# Patient Record
Sex: Male | Born: 2000 | Race: White | Hispanic: No | Marital: Single | Smoking: Never smoker
Health system: Southern US, Community
[De-identification: ages and names within clinical notes are randomized; demographics above are authoritative.]

## PROBLEM LIST (undated history)

## (undated) DIAGNOSIS — J45909 Unspecified asthma, uncomplicated: Secondary | ICD-10-CM

## (undated) DIAGNOSIS — L709 Acne, unspecified: Secondary | ICD-10-CM

## (undated) HISTORY — PX: TONSILLECTOMY: SUR1361

## (undated) HISTORY — PX: OTHER SURGICAL HISTORY: SHX169

---
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
## 2018-06-01 ENCOUNTER — Encounter: Payer: Self-pay | Admitting: Emergency Medicine

## 2018-06-01 ENCOUNTER — Other Ambulatory Visit: Payer: Self-pay

## 2018-06-01 ENCOUNTER — Ambulatory Visit
Admission: EM | Admit: 2018-06-01 | Discharge: 2018-06-01 | Disposition: A | Discharge status: Home / Self Care | Payer: Managed Care, Other (non HMO) | Attending: Family Medicine | Admitting: Family Medicine

## 2018-06-01 DIAGNOSIS — H6690 Otitis media, unspecified, unspecified ear: Secondary | ICD-10-CM

## 2018-06-01 MED ORDER — AMOXICILLIN 875 MG PO TABS: 875.0000 mg | ORAL_TABLET | Freq: Two times a day (BID) | ORAL | 0 refills | Status: AC

## 2018-06-01 NOTE — ED Provider Notes (Signed)
[facility] ____________________________________________  Time seen: Approximately 8:05 PM  I have reviewed the triage vital signs and the nursing notes.   HISTORY  Chief Complaint Otalgia (right)   HPI Andre Brooks is a 17 y.o. male presents with mother at bedside for evaluation of right ear pain.  Reports right ear pain has been present for the last 3 days.  States does feel some ear pain with chewing as well as touching in front of his ear.  Denies any skin changes.  Reports has been swimming and wakeboarding a lot recently.  Denies abrupt onset or trauma.  Denies change in hearing, ear drainage or bleeding.  Denies accompanying cough, congestion, fevers or sore throat.  Reports continues to eat and drink well.  Denies aggravating or alleviating factors.  Reports otherwise feels well.  Denies recent sickness. Denies recent antibiotic use.   Mickey Farber, MD: PCP   Past Medical History:  Diagnosis Date  . Acne    on unknown acne med  . Asthma     There are no active problems to display for this patient.   Past Surgical History:  Procedure Laterality Date  . addenoids    . TONSILLECTOMY       No current facility-administered medications for this encounter.   Current Outpatient Medications:  .  cetirizine (ZYRTEC) 10 MG tablet, Take 10 mg by mouth daily., Disp: , Rfl:  .  amoxicillin (AMOXIL) 875 MG tablet, Take 1 tablet (875 mg total) by mouth 2 (two) times daily., Disp: 20 tablet, Rfl: 0  Allergies Patient has no known allergies.  Family History  Problem Relation Age of Onset  . Healthy Mother   . Healthy Father     Social History Social History   Tobacco Use  . Smoking status: Never Smoker  . Smokeless tobacco: Never Used  Substance Use Topics  . Alcohol use: No  . Drug use: Not on file    Review of Systems Constitutional: No fever ENT: No sore throat. As above.  Cardiovascular: Denies chest pain. Respiratory: Denies shortness of  breath. Gastrointestinal: No abdominal pain.   Musculoskeletal: Negative for back pain. Skin: Negative for rash.  ____________________________________________   PHYSICAL EXAM:  VITAL SIGNS: ED Triage Vitals  Enc Vitals Group     BP 06/01/18 1846 126/74     Pulse Rate 06/01/18 1846 (!) 55     Resp 06/01/18 1846 16     Temp 06/01/18 1846 98 F (36.7 C)     Temp Source 06/01/18 1846 Oral     SpO2 06/01/18 1846 100 %     Weight 06/01/18 1845 180 lb (81.6 kg)     Height 06/01/18 1845 6\' 1"  (1.854 m)     Head Circumference --      Peak Flow --      Pain Score 06/01/18 1845 2     Pain Loc --      Pain Edu? --      Excl. in GC? --     Constitutional: Alert and oriented. Well appearing and in no acute distress. Eyes: Conjunctivae are normal.  Head: Atraumatic. No sinus tenderness to palpation. No swelling. No erythema.  Ears: Left: Nontender, no erythema, normal TM.  Right: No tenderness auricle movement, normal canal, moderate erythema and dull TM, no drainage.  TMs appear intact.  No surrounding tenderness, swelling or erythema bilaterally.  Nose:No nasal congestion   Mouth/Throat: Mucous membranes are moist. No pharyngeal erythema. No tonsillar swelling or exudate.  Neck: No stridor.  No cervical spine tenderness to palpation. Hematological/Lymphatic/Immunilogical: No cervical lymphadenopathy. Cardiovascular: Normal rate, regular rhythm. Grossly normal heart sounds.  Good peripheral circulation. Respiratory: Normal respiratory effort.  No retractions. No wheezes, rales or rhonchi. Good air movement.  Musculoskeletal: Ambulatory with steady gait.  Neurologic:  Normal speech and language. No gait instability. Skin:  Skin appears warm, dry Psychiatric: Mood and affect are normal. Speech and behavior are normal.  ___________________________________________   LABS (all labs ordered are listed, but only abnormal results are displayed)  Labs Reviewed - No data to  display ____________________________________________  PROCEDURES Procedures    INITIAL IMPRESSION / ASSESSMENT AND PLAN / ED COURSE  Pertinent labs & imaging results that were available during my care of the patient were reviewed by me and considered in my medical decision making (see chart for details).  Well-appearing patient.  No acute distress.  Right otitis media noted.  Will treat with oral amoxicillin.  Encourage rest, fluids, supportive care.Discussed indication, risks and benefits of medications with patient.  Discussed follow up with Primary care physician this week. Discussed follow up and return parameters including no resolution or any worsening concerns. Patient verbalized understanding and agreed to plan.   ____________________________________________   FINAL CLINICAL IMPRESSION(S) / ED DIAGNOSES  Final diagnoses:  Acute otitis media, unspecified otitis media type     ED Discharge Orders        Ordered    amoxicillin (AMOXIL) 875 MG tablet  2 times daily     06/01/18 1950       Note: This dictation was prepared with Dragon dictation along with smaller phrase technology. Any transcriptional errors that result from this process are unintentional.         Renford DillsMiller, Lavon Horn, NP 06/01/18 2046

## 2018-06-01 NOTE — Discharge Instructions (Addendum)
Take medication as prescribed. Rest. Drink plenty of fluids.  ° °Follow up with your primary care physician this week as needed. Return to Urgent care for new or worsening concerns.  ° °

## 2018-06-01 NOTE — ED Triage Notes (Signed)
Patient in today c/o right ear pain x 3 days. Patient denies fever. Patient has not tried any OTC medications.

## 2019-05-18 ENCOUNTER — Other Ambulatory Visit (HOSPITAL_COMMUNITY): Payer: Self-pay | Admitting: Gastroenterology

## 2019-05-18 ENCOUNTER — Other Ambulatory Visit: Payer: Self-pay | Admitting: Gastroenterology

## 2019-05-18 DIAGNOSIS — R1310 Dysphagia, unspecified: Secondary | ICD-10-CM

## 2019-05-24 ENCOUNTER — Ambulatory Visit: Payer: Managed Care, Other (non HMO)

## 2019-06-06 ENCOUNTER — Ambulatory Visit: Payer: Managed Care, Other (non HMO)

## 2019-06-09 ENCOUNTER — Ambulatory Visit
Admission: RE | Admit: 2019-06-09 | Discharge: 2019-06-09 | Disposition: A | Discharge status: Home / Self Care | Payer: Managed Care, Other (non HMO) | Source: Ambulatory Visit | Attending: Gastroenterology | Admitting: Gastroenterology

## 2019-06-09 ENCOUNTER — Other Ambulatory Visit: Payer: Self-pay

## 2019-06-09 DIAGNOSIS — R1310 Dysphagia, unspecified: Secondary | ICD-10-CM | POA: Insufficient documentation

## 2020-11-18 IMAGING — RF ESOPHAGUS/BARIUM SWALLOW/TABLET STUDY
12 series · 15 of 24 positions shown · non-contrast
Comparison: None.

CLINICAL DATA: Pain.  Reflux.

EXAM:
ESOPHOGRAM / BARIUM SWALLOW / BARIUM TABLET STUDY
TECHNIQUE: Combined double contrast and single contrast examination performed
using effervescent crystals, thick barium liquid, and thin barium
liquid. The patient was observed with fluoroscopy swallowing a 13 mm
barium sulphate tablet.
FLUOROSCOPY TIME:  Fluoroscopy Time:  1 minutes and 18 seconds
Number of Acquired Spot Images: 0

[Series 1: cp_standard · 0.26mm/px · 1 of 21 frames shown (1 of 12)]
[frame 2/21]
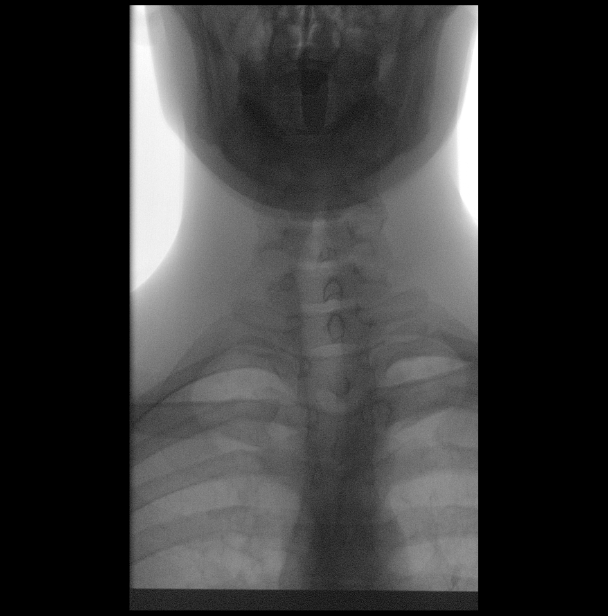

[Series 2: cp_standard · 0.26mm/px · 2 of 22 frames shown (2 of 12)]
[frame 4/22]
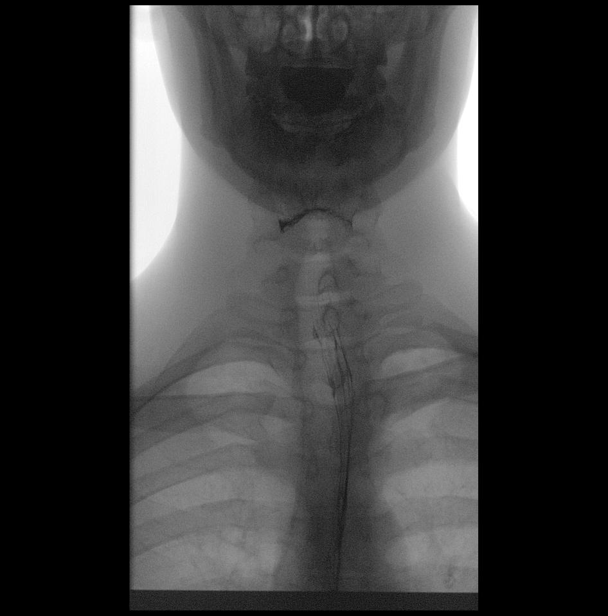
[frame 19/22]
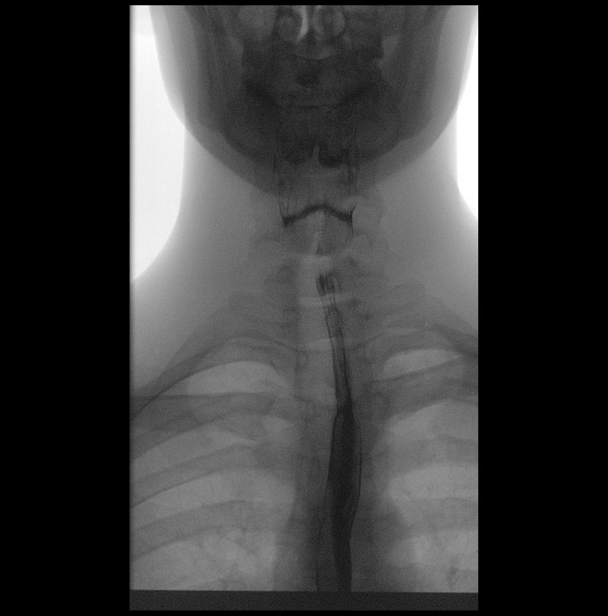

[Series 3: cp_standard · 0.26mm/px · 1 of 18 frames shown (3 of 12)]
[frame 8/18]
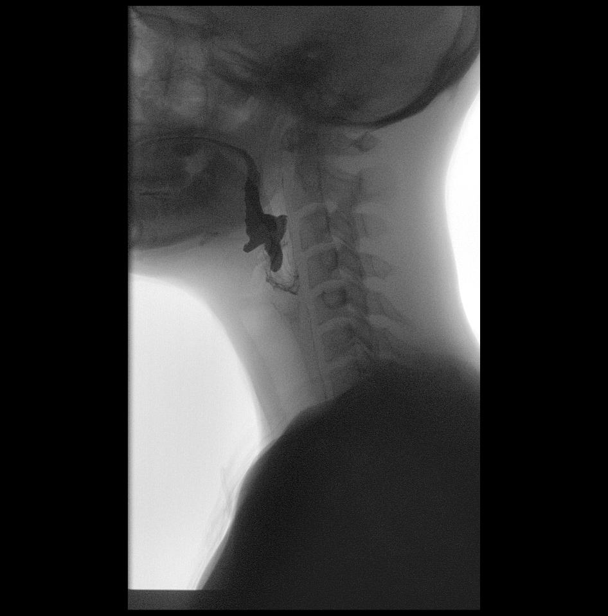

[Series 4: cp_standard · 0.26mm/px · 2 of 16 frames shown (4 of 12)]
[frame 3/16]
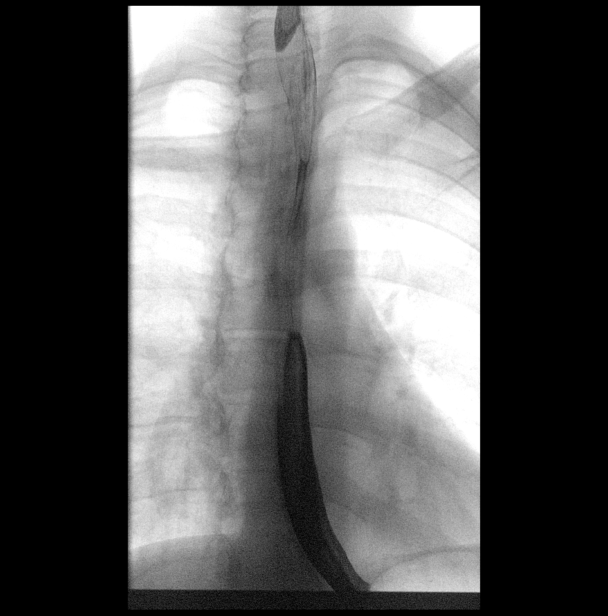
[frame 14/16]
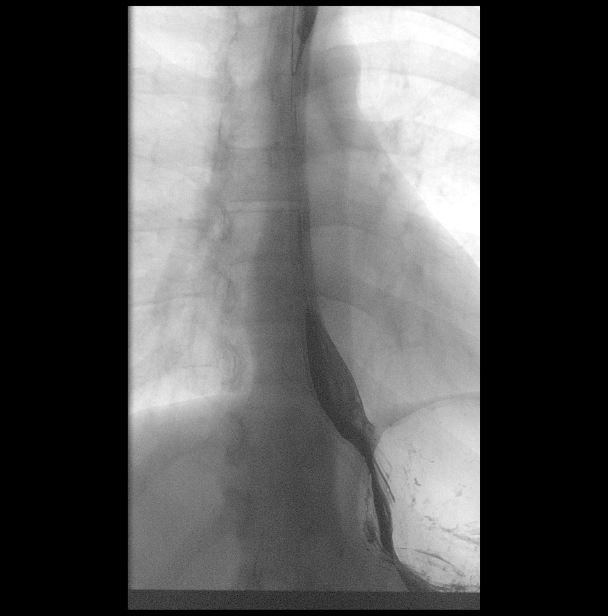

[Series 5: cp_standard · 0.26mm/px · 1 of 62 frames shown (5 of 12)]
[frame 32/62]
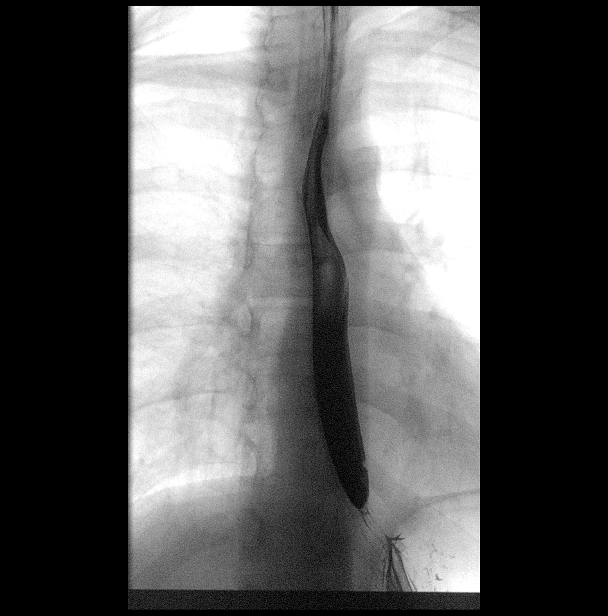

[Series 6: cp_standard · 0.26mm/px · 1 of 10 frames shown (6 of 12)]
[frame 6/10]
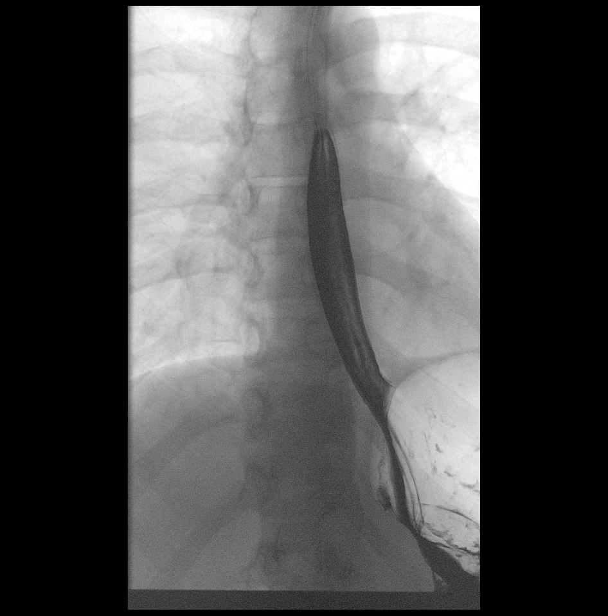

[Series 7: cp_standard · 0.26mm/px · 2 of 50 frames shown (7 of 12)]
[frame 8/50]
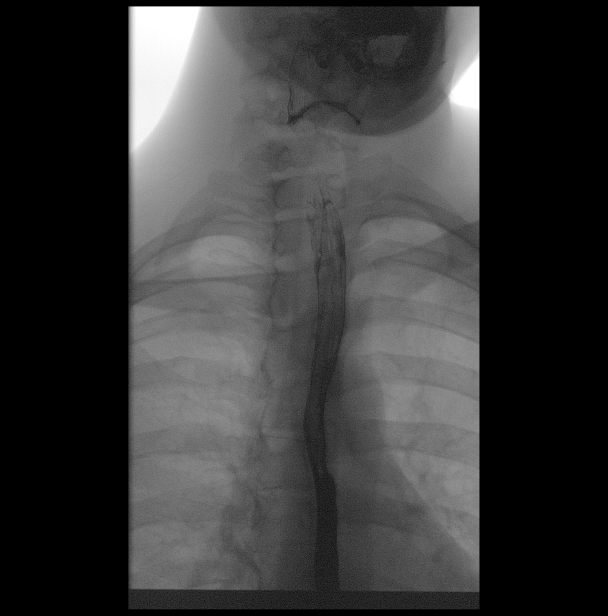
[frame 43/50]
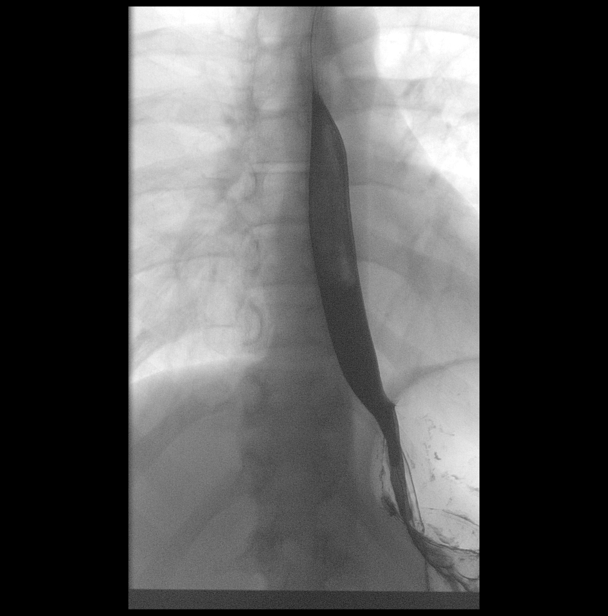

[Series 9: cp_standard · 0.27mm/px · 1 of 1 slices shown (8 of 12)]
[im 1/1]
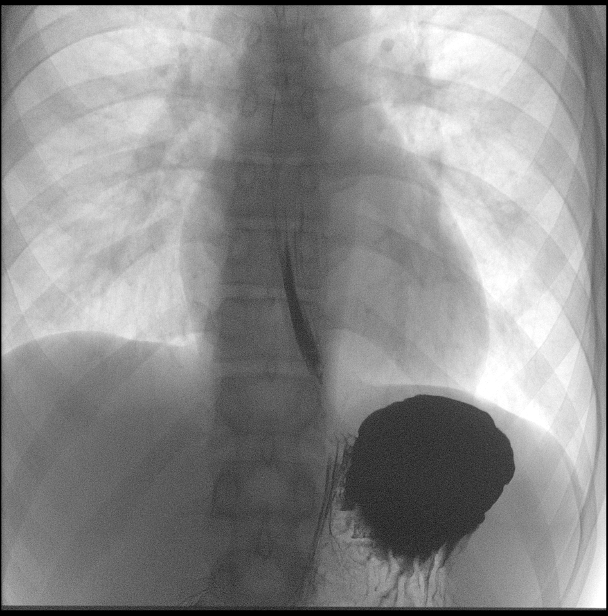

[Series 10: cp_standard · 0.27mm/px · 1 of 44 frames shown (9 of 12)]
[frame 43/44]
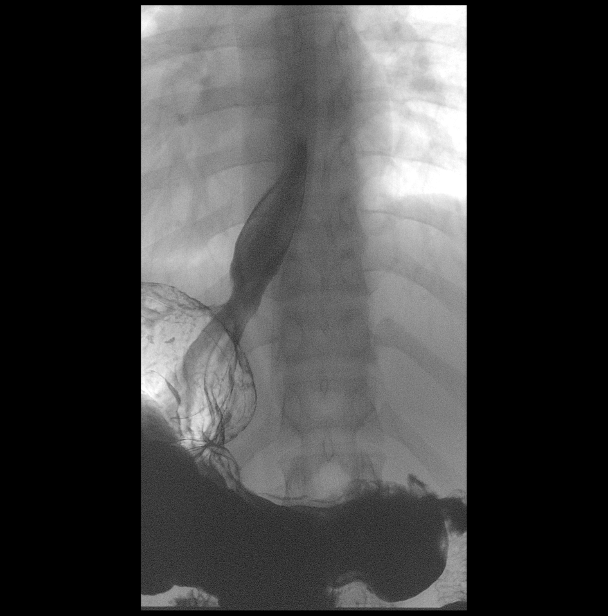

[Series 11: cp_standard · 0.27mm/px · 1 of 63 frames shown (10 of 12)]
[frame 63/63]
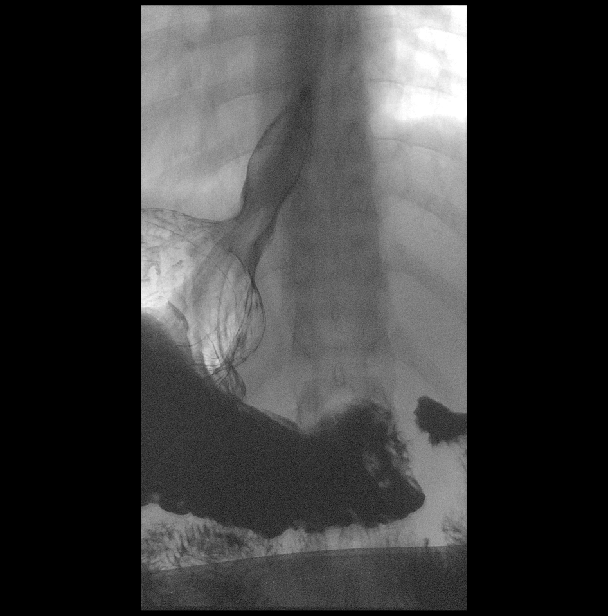

[Series 12: cp_standard · 0.27mm/px · 1 of 74 frames shown (11 of 12)]
[frame 12/74]
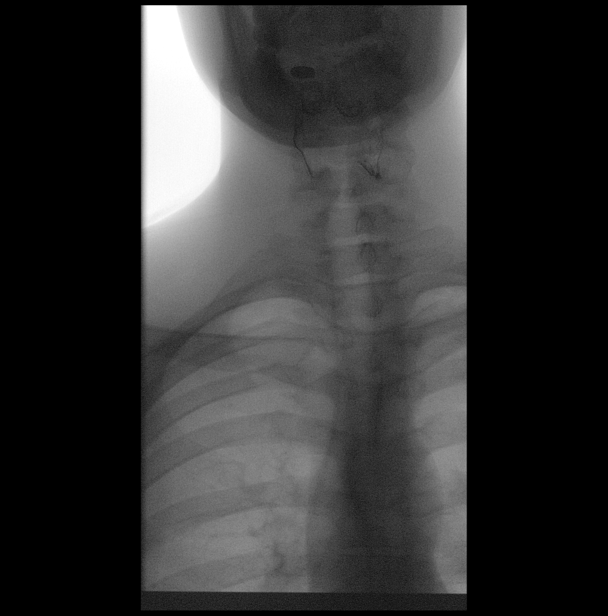

[Series 13: cp_standard · 0.26mm/px · 1 of 1 slices shown (12 of 12)]
[im 1/1]
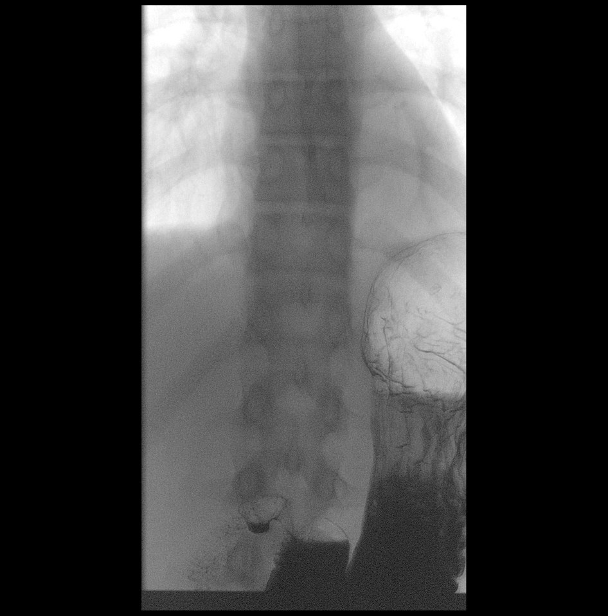

[15 of 24 positions shown; findings below may reference images not displayed]

FINDINGS: The pharynx and esophagus are normal with no masses, strictures,
mucosal abnormalities, or dysmotility identified. The barium tablet
passed normally. A single view of the stomach was normal.
IMPRESSION: Normal study.

## 2020-11-26 ENCOUNTER — Other Ambulatory Visit: Payer: Managed Care, Other (non HMO)

## 2020-11-26 DIAGNOSIS — Z20822 Contact with and (suspected) exposure to covid-19: Secondary | ICD-10-CM

## 2020-11-28 LAB — NOVEL CORONAVIRUS, NAA: SARS-CoV-2, NAA: DETECTED — AB

## 2020-11-28 LAB — SARS-COV-2, NAA 2 DAY TAT
